# Patient Record
Sex: Male | Born: 1980 | Race: White | Hispanic: No | Marital: Married | Smoking: Never smoker
Health system: Southern US, Community
[De-identification: ages and names within clinical notes are randomized; demographics above are authoritative.]

## PROBLEM LIST (undated history)

## (undated) HISTORY — PX: NOSE SURGERY: SHX723

---

## 2008-12-28 ENCOUNTER — Ambulatory Visit: Payer: Self-pay | Admitting: Family Medicine

## 2011-09-05 ENCOUNTER — Emergency Department: Payer: Self-pay | Admitting: Emergency Medicine
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
## 2018-09-02 ENCOUNTER — Emergency Department: Payer: Commercial Managed Care - HMO

## 2018-09-02 ENCOUNTER — Emergency Department
Admission: EM | Admit: 2018-09-02 | Discharge: 2018-09-02 | Disposition: A | Discharge status: Home / Self Care | Payer: Commercial Managed Care - HMO | Attending: Emergency Medicine | Admitting: Emergency Medicine

## 2018-09-02 ENCOUNTER — Encounter: Payer: Self-pay | Admitting: Emergency Medicine

## 2018-09-02 DIAGNOSIS — W1789XA Other fall from one level to another, initial encounter: Secondary | ICD-10-CM | POA: Insufficient documentation

## 2018-09-02 DIAGNOSIS — Z23 Encounter for immunization: Secondary | ICD-10-CM | POA: Diagnosis not present

## 2018-09-02 DIAGNOSIS — Y999 Unspecified external cause status: Secondary | ICD-10-CM | POA: Diagnosis not present

## 2018-09-02 DIAGNOSIS — Y92239 Unspecified place in hospital as the place of occurrence of the external cause: Secondary | ICD-10-CM | POA: Diagnosis not present

## 2018-09-02 DIAGNOSIS — S022XXB Fracture of nasal bones, initial encounter for open fracture: Secondary | ICD-10-CM | POA: Insufficient documentation

## 2018-09-02 DIAGNOSIS — Y939 Activity, unspecified: Secondary | ICD-10-CM | POA: Insufficient documentation

## 2018-09-02 DIAGNOSIS — S0993XA Unspecified injury of face, initial encounter: Secondary | ICD-10-CM | POA: Diagnosis present

## 2018-09-02 LAB — CBC
HEMATOCRIT: 44.4 % (ref 40.0–52.0)
HEMOGLOBIN: 15.4 g/dL (ref 13.0–18.0)
MCH: 30.3 pg (ref 26.0–34.0)
MCHC: 34.7 g/dL (ref 32.0–36.0)
MCV: 87.5 fL (ref 80.0–100.0)
Platelets: 176 10*3/uL (ref 150–440)
RBC: 5.08 MIL/uL (ref 4.40–5.90)
RDW: 13.1 % (ref 11.5–14.5)
WBC: 5.2 10*3/uL (ref 3.8–10.6)

## 2018-09-02 LAB — BASIC METABOLIC PANEL
ANION GAP: 4 — AB (ref 5–15)
BUN: 17 mg/dL (ref 6–20)
CO2: 30 mmol/L (ref 22–32)
Calcium: 8.4 mg/dL — ABNORMAL LOW (ref 8.9–10.3)
Chloride: 104 mmol/L (ref 98–111)
Creatinine, Ser: 0.93 mg/dL (ref 0.61–1.24)
GFR calc Af Amer: >60 mL/min (ref >60)
GFR calc non Af Amer: >60 mL/min (ref >60)
GLUCOSE: 119 mg/dL — AB (ref 70–99)
POTASSIUM: 3.2 mmol/L — AB (ref 3.5–5.1)
Sodium: 138 mmol/L (ref 135–145)

## 2018-09-02 MED ORDER — TETANUS-DIPHTH-ACELL PERTUSSIS 5-2.5-18.5 LF-MCG/0.5 IM SUSP
INTRAMUSCULAR | Status: AC
  Administered 2018-09-02: 0.5 mL via INTRAMUSCULAR
  Filled 2018-09-02: qty 0.5

## 2018-09-02 MED ORDER — ONDANSETRON HCL 4 MG/2ML IJ SOLN
4.0000 mg | Freq: Once | INTRAMUSCULAR | Status: AC
  Administered 2018-09-02: 4 mg via INTRAVENOUS
  Filled 2018-09-02: qty 2

## 2018-09-02 MED ORDER — AMOXICILLIN-POT CLAVULANATE 875-125 MG PO TABS: 1.0000 | ORAL_TABLET | Freq: Two times a day (BID) | ORAL | 0 refills | Status: AC

## 2018-09-02 MED ORDER — MORPHINE SULFATE (PF) 4 MG/ML IV SOLN
4.0000 mg | Freq: Once | INTRAVENOUS | Status: AC
  Administered 2018-09-02: 4 mg via INTRAVENOUS
  Filled 2018-09-02: qty 1

## 2018-09-02 MED ORDER — AMOXICILLIN-POT CLAVULANATE 875-125 MG PO TABS
1.0000 | ORAL_TABLET | Freq: Once | ORAL | Status: AC
  Administered 2018-09-02: 1 via ORAL
  Filled 2018-09-02: qty 1

## 2018-09-02 MED ORDER — HYDROCODONE-ACETAMINOPHEN 5-325 MG PO TABS: 1.0000 | ORAL_TABLET | Freq: Four times a day (QID) | ORAL | 0 refills | Status: DC | PRN

## 2018-09-02 MED ORDER — TETANUS-DIPHTH-ACELL PERTUSSIS 5-2.5-18.5 LF-MCG/0.5 IM SUSP
0.5000 mL | Freq: Once | INTRAMUSCULAR | Status: AC
  Administered 2018-09-02: 0.5 mL via INTRAMUSCULAR

## 2018-09-02 NOTE — ED Notes (Signed)
Patient transported to CT 
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
## 2018-09-02 NOTE — ED Notes (Signed)
ED Provider at bedside. 

## 2018-09-02 NOTE — Discharge Instructions (Addendum)
Use Tylenol or Advil as needed for pain.  For severe pain you can take the Vicodin 1 pill 4 times a day.  Be careful can make you constipated and woozy.  Do not drive if you are taking it.  Do not take Tylenol and Vicodin together.  That would be too much Tylenol take the Augmentin 1 twice a day that is an antibiotic that should help prevent infection.  Return for increasing pain swelling or fever.  Call Dr. Gregary CromerVaught's office early Monday morning let them know he saw you in the ER wanted to see you on Monday.  They should be out of schedule you an appointment.

## 2018-09-02 NOTE — ED Notes (Signed)
Patient transported to X-ray 

## 2018-09-07 ENCOUNTER — Other Ambulatory Visit: Payer: Self-pay

## 2018-09-07 ENCOUNTER — Ambulatory Visit: Payer: Commercial Managed Care - HMO | Admitting: Anesthesiology

## 2018-09-07 ENCOUNTER — Encounter: Payer: Self-pay | Admitting: *Deleted

## 2018-09-07 ENCOUNTER — Encounter
Admission: RE | Disposition: A | Discharge status: Home / Self Care | Payer: Self-pay | Source: Ambulatory Visit | Attending: Otolaryngology

## 2018-09-07 ENCOUNTER — Ambulatory Visit
Admission: RE | Admit: 2018-09-07 | Discharge: 2018-09-07 | Disposition: A | Discharge status: Home / Self Care | Payer: Commercial Managed Care - HMO | Source: Ambulatory Visit | Attending: Otolaryngology | Admitting: Otolaryngology

## 2018-09-07 DIAGNOSIS — W19XXXA Unspecified fall, initial encounter: Secondary | ICD-10-CM | POA: Diagnosis not present

## 2018-09-07 DIAGNOSIS — S022XXA Fracture of nasal bones, initial encounter for closed fracture: Secondary | ICD-10-CM | POA: Insufficient documentation

## 2018-09-07 HISTORY — PX: CLOSED REDUCTION NASAL FRACTURE: SHX5365

## 2018-09-07 SURGERY — CLOSED REDUCTION, FRACTURE, NASAL BONE
Anesthesia: General

## 2018-09-07 MED ORDER — ACETAMINOPHEN 10 MG/ML IV SOLN
INTRAVENOUS | Status: AC
  Filled 2018-09-07: qty 100

## 2018-09-07 MED ORDER — DEXAMETHASONE SODIUM PHOSPHATE 10 MG/ML IJ SOLN
INTRAMUSCULAR | Status: AC
  Filled 2018-09-07: qty 1

## 2018-09-07 MED ORDER — MIDAZOLAM HCL 2 MG/2ML IJ SOLN
INTRAMUSCULAR | Status: DC | PRN
  Administered 2018-09-07: 2 mg via INTRAVENOUS

## 2018-09-07 MED ORDER — KETAMINE HCL 50 MG/ML IJ SOLN
INTRAMUSCULAR | Status: AC
  Filled 2018-09-07: qty 10

## 2018-09-07 MED ORDER — FENTANYL CITRATE (PF) 100 MCG/2ML IJ SOLN
INTRAMUSCULAR | Status: DC | PRN
  Administered 2018-09-07: 100 ug via INTRAVENOUS

## 2018-09-07 MED ORDER — MIDAZOLAM HCL 2 MG/2ML IJ SOLN
INTRAMUSCULAR | Status: AC
  Filled 2018-09-07: qty 2

## 2018-09-07 MED ORDER — ONDANSETRON HCL 4 MG PO TABS: 4.0000 mg | ORAL_TABLET | Freq: Three times a day (TID) | ORAL | 0 refills | Status: AC | PRN

## 2018-09-07 MED ORDER — PROPOFOL 10 MG/ML IV BOLUS
INTRAVENOUS | Status: DC | PRN
  Administered 2018-09-07: 180 mg via INTRAVENOUS

## 2018-09-07 MED ORDER — ROCURONIUM BROMIDE 100 MG/10ML IV SOLN
INTRAVENOUS | Status: DC | PRN
  Administered 2018-09-07: 10 mg via INTRAVENOUS

## 2018-09-07 MED ORDER — FENTANYL CITRATE (PF) 250 MCG/5ML IJ SOLN
INTRAMUSCULAR | Status: AC
  Filled 2018-09-07: qty 5

## 2018-09-07 MED ORDER — HYDROCODONE-ACETAMINOPHEN 5-325 MG PO TABS
ORAL_TABLET | ORAL | Status: AC
  Administered 2018-09-07: 2
  Filled 2018-09-07: qty 2

## 2018-09-07 MED ORDER — HYDROCODONE-ACETAMINOPHEN 5-325 MG PO TABS: 1.0000 | ORAL_TABLET | ORAL | 0 refills | Status: AC | PRN

## 2018-09-07 MED ORDER — DEXMEDETOMIDINE HCL 200 MCG/2ML IV SOLN
INTRAVENOUS | Status: DC | PRN
  Administered 2018-09-07 (×2): 8 ug via INTRAVENOUS

## 2018-09-07 MED ORDER — ONDANSETRON HCL 4 MG/2ML IJ SOLN
INTRAMUSCULAR | Status: AC
  Filled 2018-09-07: qty 2

## 2018-09-07 MED ORDER — ACETAMINOPHEN 10 MG/ML IV SOLN
INTRAVENOUS | Status: DC | PRN
  Administered 2018-09-07: 1000 mg via INTRAVENOUS

## 2018-09-07 MED ORDER — LIDOCAINE-EPINEPHRINE 1 %-1:100000 IJ SOLN
INTRAMUSCULAR | Status: AC
  Filled 2018-09-07: qty 1

## 2018-09-07 MED ORDER — FAMOTIDINE 20 MG PO TABS
ORAL_TABLET | ORAL | Status: AC
  Administered 2018-09-07: 20 mg via ORAL
  Filled 2018-09-07: qty 1

## 2018-09-07 MED ORDER — FAMOTIDINE 20 MG PO TABS
20.0000 mg | ORAL_TABLET | Freq: Once | ORAL | Status: AC
  Administered 2018-09-07: 20 mg via ORAL

## 2018-09-07 MED ORDER — FENTANYL CITRATE (PF) 100 MCG/2ML IJ SOLN: 25.0000 ug | INTRAMUSCULAR | Status: DC | PRN

## 2018-09-07 MED ORDER — LACTATED RINGERS IV SOLN
INTRAVENOUS | Status: DC
  Administered 2018-09-07: 07:00:00 via INTRAVENOUS

## 2018-09-07 MED ORDER — DEXAMETHASONE SODIUM PHOSPHATE 10 MG/ML IJ SOLN
INTRAMUSCULAR | Status: DC | PRN
  Administered 2018-09-07: 10 mg via INTRAVENOUS

## 2018-09-07 MED ORDER — ONDANSETRON HCL 4 MG/2ML IJ SOLN
INTRAMUSCULAR | Status: DC | PRN
  Administered 2018-09-07: 4 mg via INTRAVENOUS

## 2018-09-07 MED ORDER — SUCCINYLCHOLINE CHLORIDE 20 MG/ML IJ SOLN
INTRAMUSCULAR | Status: AC
  Filled 2018-09-07: qty 1

## 2018-09-07 MED ORDER — OXYMETAZOLINE HCL 0.05 % NA SOLN
NASAL | Status: AC
Start: 2018-09-07 — End: ?
  Filled 2018-09-07: qty 30

## 2018-09-07 MED ORDER — SUCCINYLCHOLINE CHLORIDE 20 MG/ML IJ SOLN
INTRAMUSCULAR | Status: DC | PRN
  Administered 2018-09-07: 100 mg via INTRAVENOUS

## 2018-09-07 MED ORDER — ONDANSETRON HCL 4 MG/2ML IJ SOLN: 4.0000 mg | Freq: Once | INTRAMUSCULAR | Status: DC | PRN

## 2018-09-07 MED ORDER — PROPOFOL 10 MG/ML IV BOLUS
INTRAVENOUS | Status: AC
  Filled 2018-09-07: qty 20

## 2018-09-07 MED ORDER — LIDOCAINE HCL (PF) 2 % IJ SOLN
INTRAMUSCULAR | Status: AC
  Filled 2018-09-07: qty 10

## 2018-09-07 MED ORDER — LIDOCAINE HCL (CARDIAC) PF 100 MG/5ML IV SOSY
PREFILLED_SYRINGE | INTRAVENOUS | Status: DC | PRN
  Administered 2018-09-07: 100 mg via INTRAVENOUS

## 2018-09-07 MED ORDER — BACITRACIN ZINC 500 UNIT/GM EX OINT
TOPICAL_OINTMENT | CUTANEOUS | Status: AC
  Filled 2018-09-07: qty 28.35

## 2018-09-07 MED ORDER — ROCURONIUM BROMIDE 50 MG/5ML IV SOLN
INTRAVENOUS | Status: AC
  Filled 2018-09-07: qty 1

## 2018-09-07 SURGICAL SUPPLY — 38 items
BLADE SURG 15 STRL LF DISP TIS (BLADE) ×1 IMPLANT
BLADE SURG 15 STRL SS (BLADE)
BNDG EYE OVAL (GAUZE/BANDAGES/DRESSINGS) ×1 IMPLANT
CANISTER SUCT 1200ML W/VALVE (MISCELLANEOUS) ×4 IMPLANT
CLOSURE WOUND 1/2 X4 (GAUZE/BANDAGES/DRESSINGS) ×1
CLOSURE WOUND 1/4X4 (GAUZE/BANDAGES/DRESSINGS)
CNTNR SPEC 2.5X3XGRAD LEK (MISCELLANEOUS) ×2
COAG SUCT 10F 3.5MM HAND CTRL (MISCELLANEOUS) ×1 IMPLANT
CONT SPEC 4OZ STER OR WHT (MISCELLANEOUS) ×2
CONTAINER SPEC 2.5X3XGRAD LEK (MISCELLANEOUS) ×2 IMPLANT
CUP MEDICINE 2OZ PLAST GRAD ST (MISCELLANEOUS) ×4 IMPLANT
DRESSING NASL FOAM PST OP SINU (MISCELLANEOUS) IMPLANT
DRSG NASAL FOAM POST OP SINU (MISCELLANEOUS)
ELECT REM PT RETURN 9FT ADLT (ELECTROSURGICAL) ×4
ELECTRODE REM PT RTRN 9FT ADLT (ELECTROSURGICAL) ×2 IMPLANT
GAUZE PACK 2X3YD (MISCELLANEOUS) ×1 IMPLANT
GAUZE SPONGE 4X4 12PLY STRL (GAUZE/BANDAGES/DRESSINGS) ×1 IMPLANT
GLOVE BIO SURGEON STRL SZ7.5 (GLOVE) ×5 IMPLANT
GOWN STRL REUS W/ TWL LRG LVL3 (GOWN DISPOSABLE) ×4 IMPLANT
GOWN STRL REUS W/TWL LRG LVL3 (GOWN DISPOSABLE) ×4
LABEL OR SOLS (LABEL) ×1 IMPLANT
NS IRRIG 500ML POUR BTL (IV SOLUTION) ×1 IMPLANT
PACK HEAD/NECK (MISCELLANEOUS) ×4 IMPLANT
PATTIES SURGICAL .5 X3 (DISPOSABLE) ×1 IMPLANT
SOL ANTI-FOG 6CC FOG-OUT (MISCELLANEOUS) ×1 IMPLANT
SOL FOG-OUT ANTI-FOG 6CC (MISCELLANEOUS)
SPLINT NASAL REUTER .5MM (MISCELLANEOUS) IMPLANT
SPLINT NASAL SEPTAL PRE-CUT (MISCELLANEOUS) IMPLANT
SPONGE NEURO XRAY DETECT 1X3 (DISPOSABLE) ×4 IMPLANT
STRIP CLOSURE SKIN 1/2X4 (GAUZE/BANDAGES/DRESSINGS) ×3 IMPLANT
STRIP CLOSURE SKIN 1/4X4 (GAUZE/BANDAGES/DRESSINGS) ×1 IMPLANT
SUT CHROMIC 4 0 RB 1X27 (SUTURE) ×1 IMPLANT
SUT ETH BLK MONO 3 0 FS 1 12/B (SUTURE) ×1 IMPLANT
SYR 30ML LL (SYRINGE) ×1 IMPLANT
SYR 3ML LL SCALE MARK (SYRINGE) ×1 IMPLANT
TUBING CONNECTING 10 (TUBING) ×1 IMPLANT
TUBING CONNECTING 10' (TUBING)
WATER STERILE IRR 1000ML POUR (IV SOLUTION) ×1 IMPLANT

## 2018-09-07 NOTE — Discharge Instructions (Signed)

## 2018-09-07 NOTE — Anesthesia Post-op Follow-up Note (Signed)
Anesthesia QCDR form completed.        

## 2018-09-07 NOTE — Transfer of Care (Signed)
Immediate Anesthesia Transfer of Care Note  Patient: Frank Norman  Procedure(s) Performed: CLOSED REDUCTION NASAL FRACTURE (N/A )  Patient Location: PACU  Anesthesia Type:General  Level of Consciousness: drowsy  Airway & Oxygen Therapy: Patient Spontanous Breathing and aerosol face mask  Post-op Assessment: Report given to RN and Post -op Vital signs reviewed and stable  Post vital signs: Reviewed and stable  Last Vitals:  Vitals Value Taken Time  BP 115/72 09/07/2018  9:35 AM  Temp    Pulse 79 09/07/2018  9:36 AM  Resp 18 09/07/2018  9:36 AM  SpO2 99 % 09/07/2018  9:36 AM  Vitals shown include unvalidated device data.  Last Pain:  Vitals:   09/07/18 0651  TempSrc: Oral  PainSc: 0-No pain         Complications: No apparent anesthesia complications

## 2018-09-07 NOTE — Anesthesia Procedure Notes (Signed)
Procedure Name: Intubation Date/Time: 09/07/2018 8:51 AM Performed by: Dava NajjarFrazier, Lynsay Fesperman, CRNA Pre-anesthesia Checklist: Patient identified, Emergency Drugs available, Suction available, Patient being monitored and Timeout performed Patient Re-evaluated:Patient Re-evaluated prior to induction Oxygen Delivery Method: Circle system utilized Preoxygenation: Pre-oxygenation with 100% oxygen Induction Type: IV induction Laryngoscope Size: Miller and 2 Tube type: Oral Tube size: 7.5 mm Number of attempts: 1 Airway Equipment and Method: Stylet Placement Confirmation: ETT inserted through vocal cords under direct vision,  positive ETCO2 and breath sounds checked- equal and bilateral Secured at: 23 cm Tube secured with: Tape Dental Injury: Teeth and Oropharynx as per pre-operative assessment

## 2018-09-07 NOTE — Anesthesia Preprocedure Evaluation (Signed)
Anesthesia Evaluation  Patient identified by MRN, date of birth, ID band Patient awake    Reviewed: Allergy & Precautions, NPO status , Patient's Chart, lab work & pertinent test results  History of Anesthesia Complications Negative for: history of anesthetic complications  Airway Mallampati: II       Dental   Pulmonary neg sleep apnea, neg COPD,           Cardiovascular (-) hypertension(-) Past MI and (-) CHF (-) dysrhythmias (-) Valvular Problems/Murmurs     Neuro/Psych neg Seizures    GI/Hepatic Neg liver ROS, neg GERD  ,  Endo/Other  neg diabetes  Renal/GU negative Renal ROS     Musculoskeletal   Abdominal   Peds  Hematology   Anesthesia Other Findings   Reproductive/Obstetrics                             Anesthesia Physical Anesthesia Plan  ASA: I  Anesthesia Plan: General   Post-op Pain Management:    Induction: Intravenous  PONV Risk Score and Plan: 2 and Dexamethasone and Ondansetron  Airway Management Planned: Oral ETT  Additional Equipment:   Intra-op Plan:   Post-operative Plan:   Informed Consent: I have reviewed the patients History and Physical, chart, labs and discussed the procedure including the risks, benefits and alternatives for the proposed anesthesia with the patient or authorized representative who has indicated his/her understanding and acceptance.     Plan Discussed with:   Anesthesia Plan Comments:         Anesthesia Quick Evaluation  

## 2018-09-07 NOTE — Op Note (Signed)
..  09/07/2018  9:15 AM    [name], Frank Norman  [id number]   Pre-Op Dx:  NASAL FRACTURE  Post-op Dx: NASAL FRACTURE  Proc:Closed Reduction of Nasal Bone fracture  Surg: Frank Norman  Anes:  General  EBL:  10ml  Comp:  None  Findings:  Comminuted displaced nasal fracture with history of [facility].  Successful reduction of left convexity of nasal bone and improvement on right concavity with resolution of C-shaped deformity.  Persistent right sided nasal tip deviation despite reduction of nasal bones.  Possibly related to prior surgery.  Procedure: With the patient in a comfortable supine position, general anesthesia was administered.  At an appropriate level, Afrin soaked pledgets were placed within the patient's nasal cavities bilaterally.  A straight butter knife elevator was sized appropriately to the patient's nasal bridge.  This was used to reduce the right sided nasal bone to the correct location.  Pressure was placed on the patient's left nasal bone at this time with resultant good reduction.  Slight persistent indentation at anterior edge of nasal bone was present and this was also elevated with the elevator.  Visualization in tranasaly revealed limited cartilage in mid-septum and inferior bone deviation on maxillary crest.  Patient continued to have slight right sided nasal tip deviation that was presumed to be present prior to injury.  At this time, aquaplast splint was placed over top of steri-strips and care of the patient was transferred to Anesthesia.  Following this  The patient was returned to anesthesia, awakened, and transferred to recovery in stable condition.  Dispo:  PACU to home  Plan: No contact sports for 6 weeks.  Follow up in my office in 1 week.   Frank Norman 9:15 AM 09/07/2018

## 2018-09-07 NOTE — Anesthesia Postprocedure Evaluation (Signed)
Anesthesia Post Note  Patient: Frank Norman  Procedure(s) Performed: CLOSED REDUCTION NASAL FRACTURE (N/A )  Patient location during evaluation: PACU Anesthesia Type: General Level of consciousness: awake and alert Pain management: pain level controlled Vital Signs Assessment: post-procedure vital signs reviewed and stable Respiratory status: spontaneous breathing and respiratory function stable Cardiovascular status: stable Anesthetic complications: no     Last Vitals:  Vitals:   09/07/18 0651 09/07/18 0935  BP: 139/82 115/72  Pulse: 63 71  Resp: 16 19  Temp: (!) 36.2 C (!) 36.2 C  SpO2: 100% 99%    Last Pain:  Vitals:   09/07/18 0935  TempSrc:   PainSc: 0-No pain                 KEPHART,WILLIAM K

## 2018-09-07 NOTE — H&P (Signed)
..  History and Physical paper copy reviewed and updated date of procedure and will be scanned into system.  Patient seen and examined.  

## 2018-09-08 ENCOUNTER — Encounter: Payer: Self-pay | Admitting: Otolaryngology

## 2019-08-22 IMAGING — CR DG SHOULDER 2+V*L*
3 series · 3 of 3 positions shown · non-contrast
Comparison: None.

CLINICAL DATA: Left shoulder pain after syncope, fall.

EXAM:
LEFT SHOULDER - 2+ VIEW

[shoulder grashey]
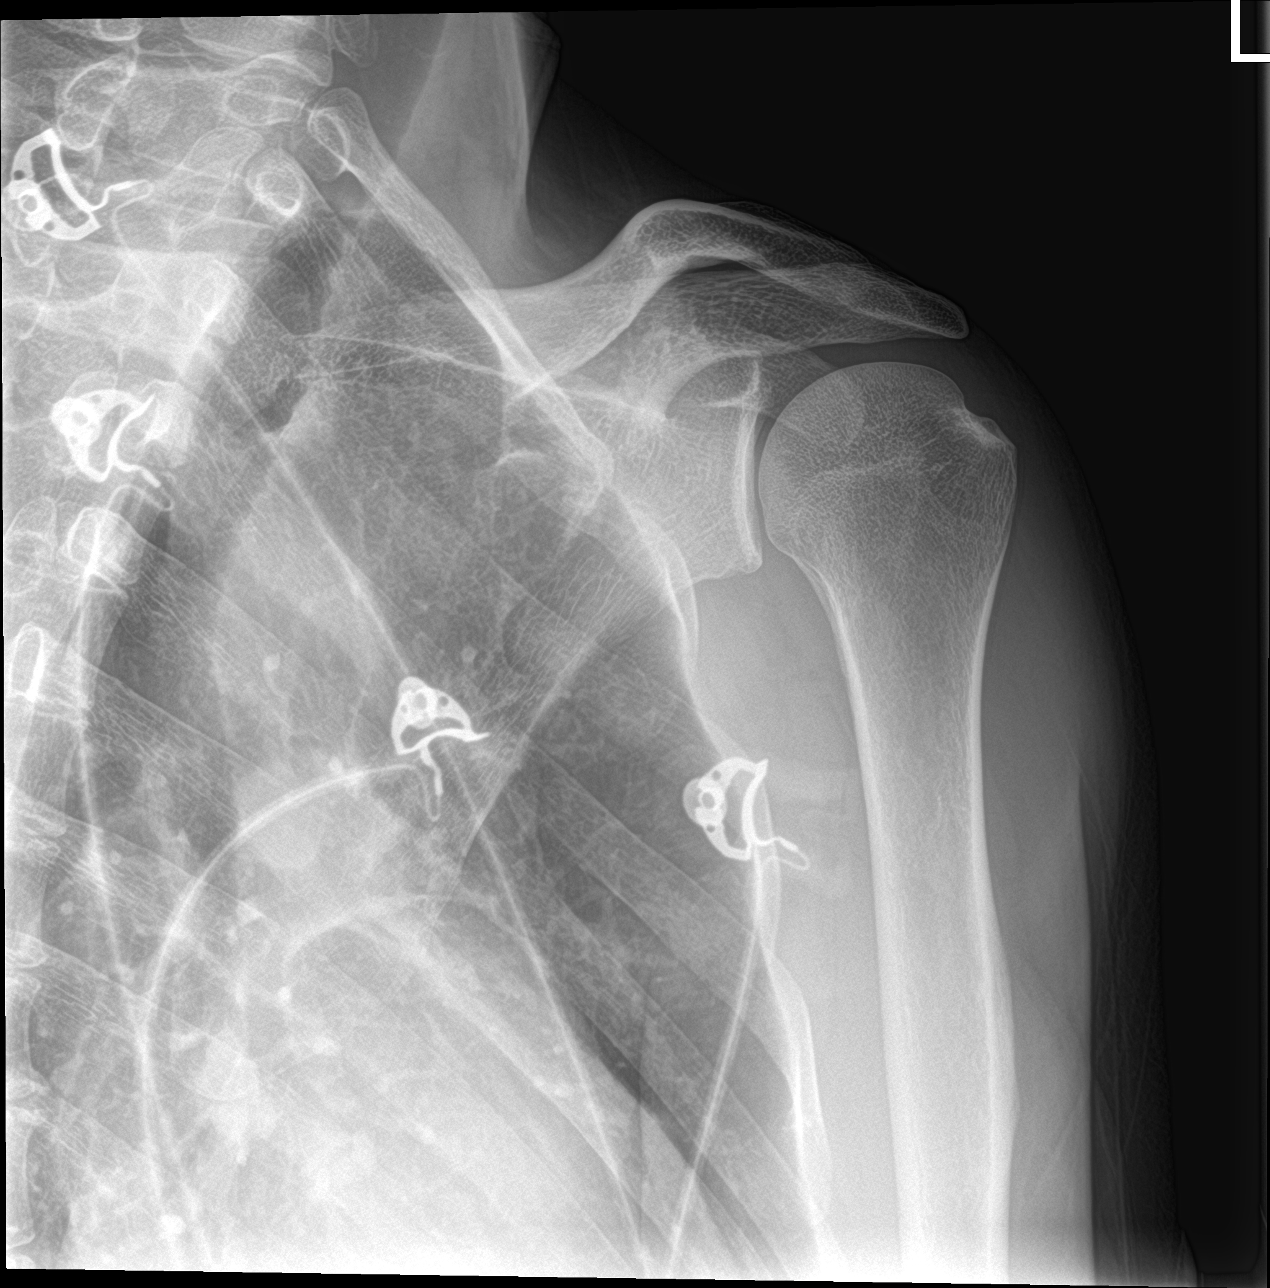

[shoulder y view]
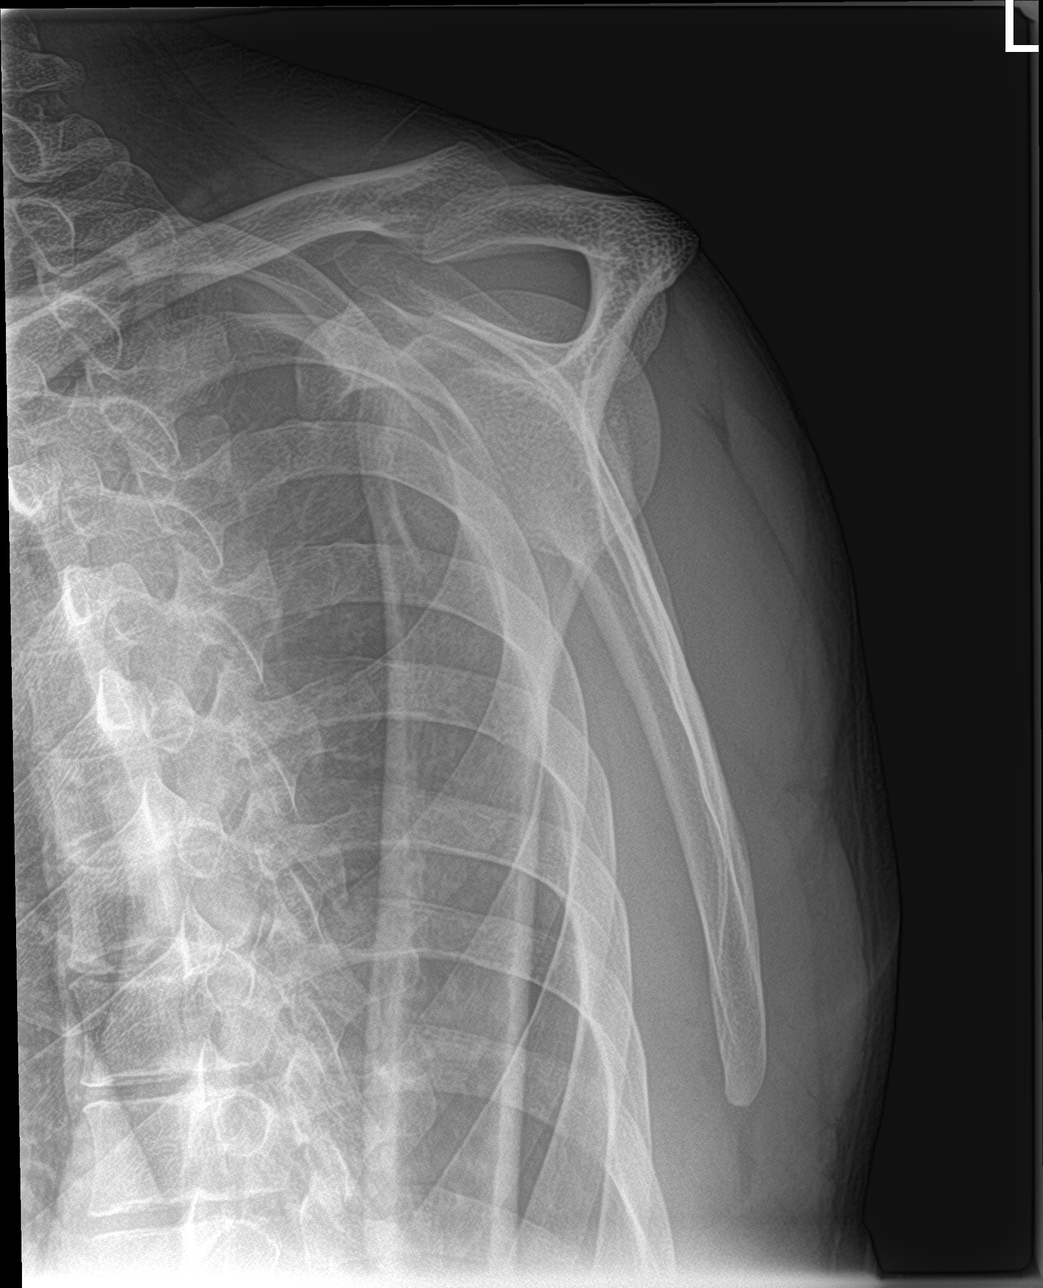

[shoulder axillary]
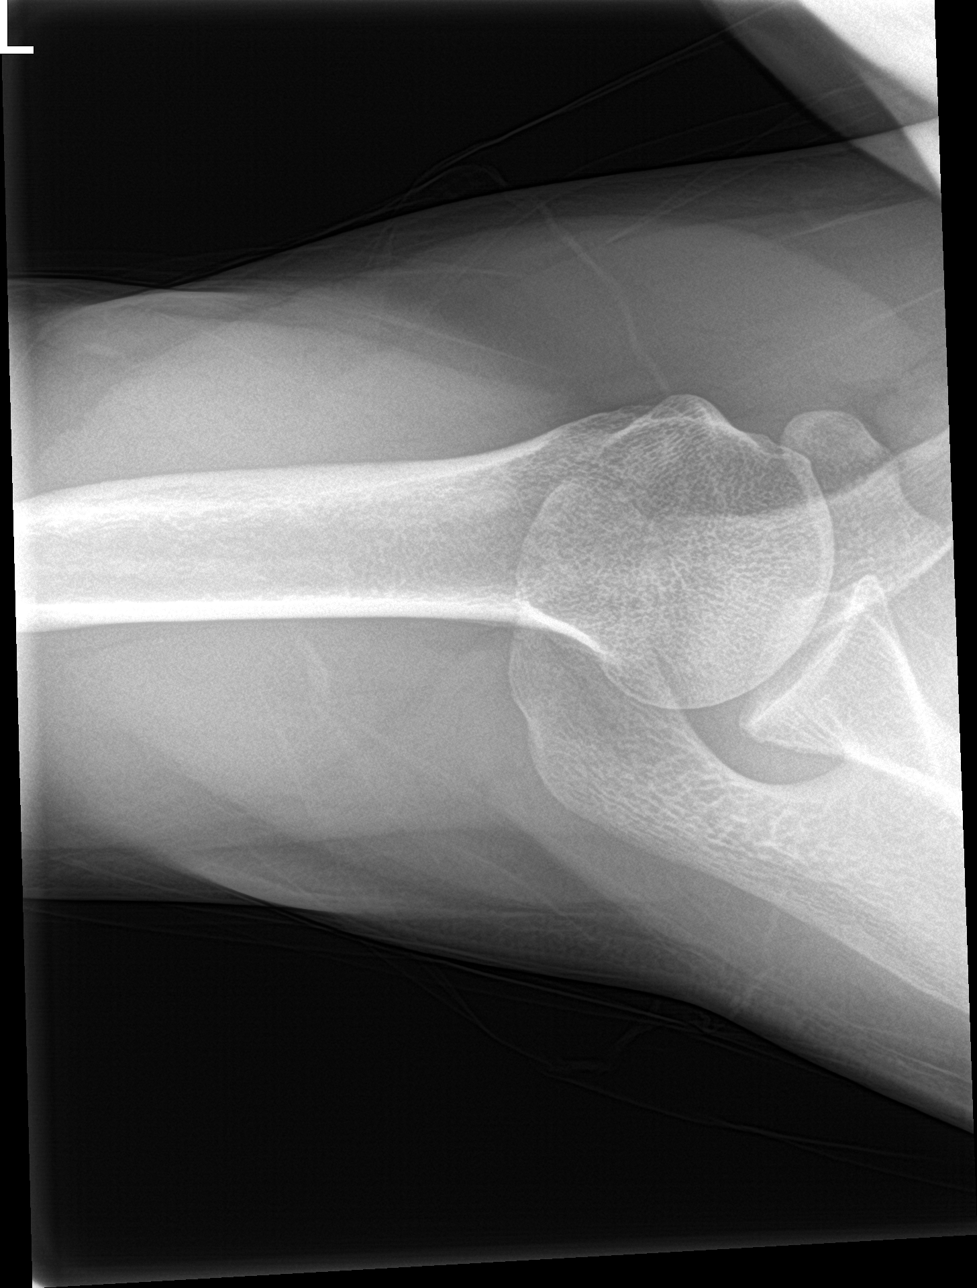

[3 of 3 positions shown; findings below may reference images not displayed]

FINDINGS: There is no evidence of fracture or dislocation. There is no
evidence of arthropathy or other focal bone abnormality. Soft
tissues are unremarkable.
IMPRESSION: Negative radiographs of the left shoulder.

## 2025-01-16 ENCOUNTER — Telehealth (HOSPITAL_COMMUNITY): Payer: Self-pay | Admitting: Emergency Medicine

## 2025-01-16 ENCOUNTER — Other Ambulatory Visit: Payer: Self-pay | Admitting: Nurse Practitioner

## 2025-01-16 DIAGNOSIS — E785 Hyperlipidemia, unspecified: Secondary | ICD-10-CM

## 2025-01-16 DIAGNOSIS — R9439 Abnormal result of other cardiovascular function study: Secondary | ICD-10-CM

## 2025-01-16 DIAGNOSIS — I2089 Other forms of angina pectoris: Secondary | ICD-10-CM

## 2025-01-16 NOTE — Telephone Encounter (Signed)
 Reaching out to patient to offer assistance regarding upcoming cardiac imaging study; pt verbalizes understanding of appt date/time, parking situation and where to check in, pre-test NPO status and medications ordered, and verified current allergies; name and call back number provided for further questions should they arise Rockwell Alexandria RN Navigator Cardiac Imaging [facility] [phone] office [phone] cell

## 2025-01-17 ENCOUNTER — Ambulatory Visit
Admission: RE | Admit: 2025-01-17 | Discharge: 2025-01-17 | Disposition: A | Discharge status: Home / Self Care | Source: Ambulatory Visit | Attending: Nurse Practitioner | Admitting: Nurse Practitioner

## 2025-01-17 DIAGNOSIS — E785 Hyperlipidemia, unspecified: Secondary | ICD-10-CM | POA: Diagnosis present

## 2025-01-17 DIAGNOSIS — R9439 Abnormal result of other cardiovascular function study: Secondary | ICD-10-CM | POA: Insufficient documentation

## 2025-01-17 DIAGNOSIS — I2089 Other forms of angina pectoris: Secondary | ICD-10-CM | POA: Insufficient documentation

## 2025-01-17 MED ORDER — DILTIAZEM HCL 25 MG/5ML IV SOLN: 10.0000 mg | INTRAVENOUS | Status: DC | PRN

## 2025-01-17 MED ORDER — METOPROLOL TARTRATE 5 MG/5ML IV SOLN
10.0000 mg | INTRAVENOUS | Status: DC | PRN
  Administered 2025-01-17: 10 mg via INTRAVENOUS
  Filled 2025-01-17: qty 10

## 2025-01-17 MED ORDER — NITROGLYCERIN 0.4 MG SL SUBL
0.8000 mg | SUBLINGUAL_TABLET | Freq: Once | SUBLINGUAL | Status: AC
  Administered 2025-01-17: 0.8 mg via SUBLINGUAL
  Filled 2025-01-17: qty 25

## 2025-01-17 MED ORDER — IOHEXOL 350 MG/ML SOLN
100.0000 mL | Freq: Once | INTRAVENOUS | Status: AC | PRN
  Administered 2025-01-17: 100 mL via INTRAVENOUS

## 2025-01-17 NOTE — Progress Notes (Signed)
 Patient tolerated procedure well. W/C to lobby.  Ambulate w/o difficulty. Denies light headedness or being dizzy. Encouraged to drink extra water today and reasoning explained. Verbalized understanding. All questions answered. ABC intact. No further needs. Discharge from procedure area w/o issues.
